# Patient Record
Sex: Female | Born: 1995 | Race: Black or African American | Hispanic: No | Marital: Single | Smoking: Never smoker
Health system: Southern US, Community
[De-identification: ages and names within clinical notes are randomized; demographics above are authoritative.]

## PROBLEM LIST (undated history)

## (undated) DIAGNOSIS — I10 Essential (primary) hypertension: Secondary | ICD-10-CM

## (undated) HISTORY — PX: TONSILLECTOMY: SUR1361

---

## 2018-03-15 ENCOUNTER — Emergency Department (HOSPITAL_BASED_OUTPATIENT_CLINIC_OR_DEPARTMENT_OTHER)
Admission: EM | Admit: 2018-03-15 | Discharge: 2018-03-15 | Disposition: A | Discharge status: Home / Self Care | Payer: Self-pay | Attending: Emergency Medicine | Admitting: Emergency Medicine

## 2018-03-15 ENCOUNTER — Encounter (HOSPITAL_BASED_OUTPATIENT_CLINIC_OR_DEPARTMENT_OTHER): Payer: Self-pay | Admitting: Emergency Medicine

## 2018-03-15 ENCOUNTER — Other Ambulatory Visit: Payer: Self-pay

## 2018-03-15 DIAGNOSIS — S61212A Laceration without foreign body of right middle finger without damage to nail, initial encounter: Secondary | ICD-10-CM | POA: Insufficient documentation

## 2018-03-15 DIAGNOSIS — Z23 Encounter for immunization: Secondary | ICD-10-CM | POA: Insufficient documentation

## 2018-03-15 DIAGNOSIS — S61210A Laceration without foreign body of right index finger without damage to nail, initial encounter: Secondary | ICD-10-CM | POA: Insufficient documentation

## 2018-03-15 DIAGNOSIS — W268XXA Contact with other sharp object(s), not elsewhere classified, initial encounter: Secondary | ICD-10-CM | POA: Insufficient documentation

## 2018-03-15 DIAGNOSIS — Y92810 Car as the place of occurrence of the external cause: Secondary | ICD-10-CM | POA: Insufficient documentation

## 2018-03-15 DIAGNOSIS — Y9389 Activity, other specified: Secondary | ICD-10-CM | POA: Insufficient documentation

## 2018-03-15 DIAGNOSIS — Y998 Other external cause status: Secondary | ICD-10-CM | POA: Insufficient documentation

## 2018-03-15 MED ORDER — TETANUS-DIPHTH-ACELL PERTUSSIS 5-2.5-18.5 LF-MCG/0.5 IM SUSP
INTRAMUSCULAR | Status: AC
  Filled 2018-03-15: qty 0.5

## 2018-03-15 MED ORDER — LIDOCAINE HCL (PF) 1 % IJ SOLN
30.0000 mL | Freq: Once | INTRAMUSCULAR | Status: AC
  Administered 2018-03-15: 30 mL

## 2018-03-15 MED ORDER — LIDOCAINE HCL 1 % IJ SOLN
INTRAMUSCULAR | Status: AC
  Filled 2018-03-15: qty 20

## 2018-03-15 MED ORDER — TETANUS-DIPHTH-ACELL PERTUSSIS 5-2.5-18.5 LF-MCG/0.5 IM SUSP
0.5000 mL | Freq: Once | INTRAMUSCULAR | Status: AC
  Administered 2018-03-15: 0.5 mL via INTRAMUSCULAR

## 2018-03-15 NOTE — ED Notes (Signed)
Dressing was already applied by RN per Pt

## 2018-03-15 NOTE — Discharge Instructions (Addendum)
Keep your wounds clean and dry. Wash with soap and water. Apply triple antibiotic ointment twice a day. Suture removal in 7-10 days. Follow up as needed.

## 2018-03-15 NOTE — ED Provider Notes (Signed)
[facility] EMERGENCY DEPARTMENT Provider Note   CSN: [id number] Arrival date & time: 03/15/18  1207     History   Chief Complaint Chief Complaint  Patient presents with  . Laceration    HPI Wanda Walsh is a 22 y.o. female.  HPI Wanda Walsh is a 22 y.o. female tensed emergency department complaining of laceration to the right second and third fingers.  Patient states that she reached behind a car seat to pick something off the floor and cut her fingertips on a razor blade that was they are.  Patient did not noted there is a really was there.  She sustained a laceration to the tips of the index and middle fingers.  Patient reports severe bleeding that was stopped with pressure.  She denies any numbness or tingling distally.  Denies any other injuries.  Tetanus is unknown.  History reviewed. No pertinent past medical history.  There are no active problems to display for this patient.   Past Surgical History:  Procedure Laterality Date  . TONSILLECTOMY       OB History   None      Home Medications    Prior to Admission medications   Not on File    Family History No family history on file.  Social History Social History   Tobacco Use  . Smoking status: Never Smoker  . Smokeless tobacco: Never Used  Substance Use Topics  . Alcohol use: Yes  . Drug use: Not on file     Allergies   Patient has no known allergies.   Review of Systems Review of Systems  Constitutional: Negative for chills and fever.  Musculoskeletal: Negative for arthralgias.  Skin: Positive for wound.  Neurological: Negative for weakness and numbness.  All other systems reviewed and are negative.    Physical Exam Updated Vital Signs BP (!) 161/112 (BP Location: Left Arm)   Pulse (!) 120   Temp 99.2 F (37.3 C) (Oral)   Resp 18   Ht 5\' 6"  (1.676 m)   Wt 108.9 kg (240 lb)   LMP 02/21/2018   SpO2 100%   BMI 38.74 kg/m   Physical Exam  Constitutional: She is  oriented to person, place, and time. She appears well-developed and well-nourished. No distress.  HENT:  Head: Normocephalic.  Eyes: Conjunctivae are normal.  Neck: Neck supple.  Cardiovascular: Normal rate, regular rhythm and normal heart sounds.  Pulmonary/Chest: Effort normal and breath sounds normal. No respiratory distress. She has no wheezes. She has no rales.  Musculoskeletal: She exhibits no edema.       Hands: 3cm laceratio to the fingertip of the index finger, gaping, no nail involvement. 0.5cm through and through the distal fignerpad of middle finger. Full rom of both fingers.   Neurological: She is alert and oriented to person, place, and time.  Skin: Skin is warm and dry.  Psychiatric: She has a normal mood and affect. Her behavior is normal.  Nursing note and vitals reviewed.    ED Treatments / Results  Labs (all labs ordered are listed, but only abnormal results are displayed) Labs Reviewed - No data to display  EKG None  Radiology No results found.  Procedures .Marland KitchenLaceration Repair Date/Time: 03/15/2018 12:58 PM Performed by: Jaynie Crumble, PA-C Authorized by: Jaynie Crumble, PA-C   Consent:    Consent obtained:  Verbal   Consent given by:  Patient   Risks discussed:  Infection, pain, need for additional repair and poor cosmetic result  Alternatives discussed:  No treatment and delayed treatment Anesthesia (see MAR for exact dosages):    Anesthesia method:  Local infiltration   Local anesthetic:  Lidocaine 2% w/o epi Laceration details:    Location:  Finger   Finger location:  R index finger   Length (cm):  3 Repair type:    Repair type:  Simple Pre-procedure details:    Preparation:  Patient was prepped and draped in usual sterile fashion Exploration:    Hemostasis achieved with:  Direct pressure   Wound extent: no nerve damage noted, no tendon damage noted and no underlying fracture noted   Treatment:    Area cleansed with:  Betadine  and saline   Amount of cleaning:  Standard   Irrigation solution:  Sterile saline   Irrigation method:  Syringe Skin repair:    Repair method:  Sutures   Suture size:  5-0   Suture material:  Nylon   Suture technique:  Simple interrupted   Number of sutures:  5 Approximation:    Approximation:  Close Post-procedure details:    Dressing:  Antibiotic ointment and non-adherent dressing   Patient tolerance of procedure:  Tolerated well, no immediate complications .Marland Kitchen.Laceration Repair Date/Time: 03/15/2018 12:59 PM Performed by: Jaynie CrumbleKirichenko, Annice Jolly, PA-C Authorized by: Jaynie CrumbleKirichenko, Mattelyn Imhoff, PA-C   Consent:    Consent obtained:  Verbal   Consent given by:  Patient   Risks discussed:  Infection, pain, need for additional repair and poor cosmetic result   Alternatives discussed:  No treatment and delayed treatment Anesthesia (see MAR for exact dosages):    Anesthesia method:  Local infiltration   Local anesthetic:  Lidocaine 2% w/o epi Laceration details:    Location:  Finger   Finger location:  R long finger   Length (cm):  1 Repair type:    Repair type:  Simple Pre-procedure details:    Preparation:  Patient was prepped and draped in usual sterile fashion Exploration:    Hemostasis achieved with:  Direct pressure   Wound exploration: wound explored through full range of motion   Treatment:    Area cleansed with:  Betadine and saline   Amount of cleaning:  Standard   Irrigation solution:  Sterile saline   Irrigation method:  Syringe Skin repair:    Repair method:  Sutures   Suture size:  5-0   Suture material:  Nylon   Suture technique:  Simple interrupted   Number of sutures:  4 Approximation:    Approximation:  Close Post-procedure details:    Dressing:  Antibiotic ointment and adhesive bandage   Patient tolerance of procedure:  Tolerated well, no immediate complications   (including critical care time)  Medications Ordered in ED Medications  lidocaine (XYLOCAINE)  1 % (with pres) injection (has no administration in time range)  Tdap (BOOSTRIX) 5-2.5-18.5 LF-MCG/0.5 injection (has no administration in time range)  lidocaine (PF) (XYLOCAINE) 1 % injection 30 mL (30 mLs Infiltration Given 03/15/18 1226)  Tdap (BOOSTRIX) injection 0.5 mL (0.5 mLs Intramuscular Given 03/15/18 1226)     Initial Impression / Assessment and Plan / ED Course  I have reviewed the triage vital signs and the nursing notes.  Pertinent labs & imaging results that were available during my care of the patient were reviewed by me and considered in my medical decision making (see chart for details).     Patient with lacerations to the distal fingertips of the index and middle fingers requiring repair with sutures.  Tetanus updated.  No  concern for bony injury.  Full range of motion of both fingers.  Home with antibiotic ointment, follow-up with primary care doctor as needed.  Suture removal in 7-10 days.  Vitals:   03/15/18 1210 03/15/18 1211  BP:  (!) 161/112  Pulse:  (!) 120  Resp:  18  Temp:  99.2 F (37.3 C)  TempSrc:  Oral  SpO2:  100%  Weight: 108.9 kg (240 lb)   Height: 5\' 6"  (1.676 m)     Final Clinical Impressions(s) / ED Diagnoses   Final diagnoses:  Laceration of right index finger without foreign body without damage to nail, initial encounter  Laceration of right middle finger without foreign body without damage to nail, initial encounter    ED Discharge Orders    None       Jaynie Crumble, PA-C 03/15/18 1536    Long, Arlyss Repress, MD 03/15/18 1944

## 2018-03-15 NOTE — ED Triage Notes (Signed)
Laceration to R index finger from a razor blade. Bleeding controlled.

## 2020-09-02 DEATH — deceased

## 2020-12-24 ENCOUNTER — Other Ambulatory Visit: Payer: Self-pay

## 2020-12-24 ENCOUNTER — Encounter (HOSPITAL_BASED_OUTPATIENT_CLINIC_OR_DEPARTMENT_OTHER): Payer: Self-pay | Admitting: Emergency Medicine

## 2020-12-24 ENCOUNTER — Emergency Department (HOSPITAL_BASED_OUTPATIENT_CLINIC_OR_DEPARTMENT_OTHER)
Admission: EM | Admit: 2020-12-24 | Discharge: 2020-12-24 | Disposition: A | Discharge status: Home / Self Care | Payer: Self-pay | Attending: Emergency Medicine | Admitting: Emergency Medicine

## 2020-12-24 DIAGNOSIS — R0789 Other chest pain: Secondary | ICD-10-CM | POA: Insufficient documentation

## 2020-12-24 DIAGNOSIS — A599 Trichomoniasis, unspecified: Secondary | ICD-10-CM | POA: Insufficient documentation

## 2020-12-24 DIAGNOSIS — R079 Chest pain, unspecified: Secondary | ICD-10-CM

## 2020-12-24 DIAGNOSIS — I10 Essential (primary) hypertension: Secondary | ICD-10-CM | POA: Insufficient documentation

## 2020-12-24 DIAGNOSIS — Z79899 Other long term (current) drug therapy: Secondary | ICD-10-CM | POA: Insufficient documentation

## 2020-12-24 DIAGNOSIS — Z20822 Contact with and (suspected) exposure to covid-19: Secondary | ICD-10-CM | POA: Insufficient documentation

## 2020-12-24 DIAGNOSIS — N390 Urinary tract infection, site not specified: Secondary | ICD-10-CM | POA: Insufficient documentation

## 2020-12-24 HISTORY — DX: Essential (primary) hypertension: I10

## 2020-12-24 LAB — COMPREHENSIVE METABOLIC PANEL
ALT: 10 U/L (ref 0–44)
AST: 12 U/L — ABNORMAL LOW (ref 15–41)
Albumin: 4.1 g/dL (ref 3.5–5.0)
Alkaline Phosphatase: 47 U/L (ref 38–126)
Anion gap: 9 (ref 5–15)
BUN: 6 mg/dL (ref 6–20)
CO2: 24 mmol/L (ref 22–32)
Calcium: 9.1 mg/dL (ref 8.9–10.3)
Chloride: 102 mmol/L (ref 98–111)
Creatinine, Ser: 0.74 mg/dL (ref 0.44–1.00)
GFR, Estimated: >60 mL/min (ref >60)
Glucose, Bld: 103 mg/dL — ABNORMAL HIGH (ref 70–99)
Potassium: 3.4 mmol/L — ABNORMAL LOW (ref 3.5–5.1)
Sodium: 135 mmol/L (ref 135–145)
Total Bilirubin: 0.7 mg/dL (ref 0.3–1.2)
Total Protein: 7.7 g/dL (ref 6.5–8.1)

## 2020-12-24 LAB — URINALYSIS, ROUTINE W REFLEX MICROSCOPIC
Bilirubin Urine: NEGATIVE
Glucose, UA: NEGATIVE mg/dL
Ketones, ur: >=80 mg/dL — AB
Nitrite: NEGATIVE
Protein, ur: NEGATIVE mg/dL
Specific Gravity, Urine: 1.025 (ref 1.005–1.030)
pH: 6 (ref 5.0–8.0)

## 2020-12-24 LAB — CBC WITH DIFFERENTIAL/PLATELET
Abs Immature Granulocytes: 0.01 10*3/uL (ref 0.00–0.07)
Basophils Absolute: 0 10*3/uL (ref 0.0–0.1)
Basophils Relative: 1 %
Eosinophils Absolute: 0 10*3/uL (ref 0.0–0.5)
Eosinophils Relative: 1 %
HCT: 39.1 % (ref 36.0–46.0)
Hemoglobin: 12.3 g/dL (ref 12.0–15.0)
Immature Granulocytes: 0 %
Lymphocytes Relative: 28 %
Lymphs Abs: 1.8 10*3/uL (ref 0.7–4.0)
MCH: 24.5 pg — ABNORMAL LOW (ref 26.0–34.0)
MCHC: 31.5 g/dL (ref 30.0–36.0)
MCV: 77.9 fL — ABNORMAL LOW (ref 80.0–100.0)
Monocytes Absolute: 0.5 10*3/uL (ref 0.1–1.0)
Monocytes Relative: 7 %
Neutro Abs: 4.1 10*3/uL (ref 1.7–7.7)
Neutrophils Relative %: 63 %
Platelets: 324 10*3/uL (ref 150–400)
RBC: 5.02 MIL/uL (ref 3.87–5.11)
RDW: 15.9 % — ABNORMAL HIGH (ref 11.5–15.5)
Smear Review: NORMAL
WBC Morphology: ABNORMAL
WBC: 6.4 10*3/uL (ref 4.0–10.5)
nRBC: 0 % (ref 0.0–0.2)

## 2020-12-24 LAB — D-DIMER, QUANTITATIVE: D-Dimer, Quant: 0.29 ug/mL-FEU (ref 0.00–0.50)

## 2020-12-24 LAB — PREGNANCY, URINE: Preg Test, Ur: NEGATIVE

## 2020-12-24 LAB — LIPASE, BLOOD: Lipase: 25 U/L (ref 11–51)

## 2020-12-24 LAB — TROPONIN I (HIGH SENSITIVITY): Troponin I (High Sensitivity): <2 ng/L (ref <18)

## 2020-12-24 LAB — URINALYSIS, MICROSCOPIC (REFLEX): WBC, UA: >50 WBC/hpf (ref 0–5)

## 2020-12-24 MED ORDER — METRONIDAZOLE 500 MG PO TABS: 500.0000 mg | ORAL_TABLET | Freq: Two times a day (BID) | ORAL | 0 refills | Status: AC

## 2020-12-24 MED ORDER — CEFTRIAXONE SODIUM 500 MG IJ SOLR
INTRAMUSCULAR | Status: AC
  Filled 2020-12-24: qty 500

## 2020-12-24 MED ORDER — LIDOCAINE HCL (PF) 1 % IJ SOLN
INTRAMUSCULAR | Status: AC
  Administered 2020-12-24: 1.2 mL
  Filled 2020-12-24: qty 5

## 2020-12-24 MED ORDER — CEFTRIAXONE SODIUM 500 MG IJ SOLR
250.0000 mg | Freq: Once | INTRAMUSCULAR | Status: AC
  Administered 2020-12-24: 250 mg via INTRAMUSCULAR

## 2020-12-24 MED ORDER — AMLODIPINE BESYLATE 2.5 MG PO TABS: 5.0000 mg | ORAL_TABLET | Freq: Every day | ORAL | 0 refills | Status: DC

## 2020-12-24 MED ORDER — CEPHALEXIN 500 MG PO CAPS: 500.0000 mg | ORAL_CAPSULE | Freq: Three times a day (TID) | ORAL | 0 refills | Status: AC

## 2020-12-24 MED ORDER — DOXYCYCLINE HYCLATE 100 MG PO CAPS: 100.0000 mg | ORAL_CAPSULE | Freq: Two times a day (BID) | ORAL | 0 refills | Status: DC

## 2020-12-24 NOTE — ED Triage Notes (Signed)
Pt arrives pov with driver, with c/o concern for hypertension. Pt reports just being started on amlodipine. Pt endorses left side CP, with mild HA. Pt ambulatory.

## 2020-12-24 NOTE — Discharge Instructions (Signed)
We are sending a swab for gonorrhea and chlamydia, if these return positive, begin/fill the doxycycline prescription given to you. If they are negative you can continue to take the flagyl for trichomonas and keflex for UTI without taking the doxycycline.

## 2020-12-24 NOTE — ED Notes (Signed)
I attempt IV access/lab draw. My colleague will attempt u/s-guided IV shortly.

## 2020-12-24 NOTE — ED Notes (Signed)
As I write this, Dr. Dalene Seltzer is examining her.

## 2020-12-24 NOTE — ED Provider Notes (Signed)
[facility] EMERGENCY DEPARTMENT Provider Note   CSN: [id number] Arrival date & time: 12/24/20  1043     History Chief Complaint  Patient presents with  . Hypertension    Wanda Walsh is a 25 y.o. female.  HPI     Chest tightness getting worse today and sharp pain with deep breaths, took amlodpine before leaving the house. Now CP nonexistent but was 5/10 before.   Low appetite for the last week, nausea, not eating or drinking much Tightness left side of chest and pressure in back for 2 days  Sleep helps but wakes up and there, can lay down and it comes, not specifically worse with exertion Shortness of breath with it, a few episodes, thought was panic attack both times, heart was racing.  1/15 had CT head, CBC, BMP Yesterday took amlodipine and BP was 173 and then 169, mom gave her clonidine because left hospital 1/16, had lab work, checked TSH which was normal, troponin negative for MI, low dose 2.5mg  amlodipine  Hx of htn in family, mom had it at 54   Past Medical History:  Diagnosis Date  . Hypertension     There are no problems to display for this patient.   Past Surgical History:  Procedure Laterality Date  . TONSILLECTOMY       OB History   No obstetric history on file.     History reviewed. No pertinent family history.  Social History   Tobacco Use  . Smoking status: Never Smoker  . Smokeless tobacco: Never Used  Substance Use Topics  . Alcohol use: Not Currently  . Drug use: Never    Home Medications Prior to Admission medications   Medication Sig Start Date End Date Taking? Authorizing Provider  cephALEXin (KEFLEX) 500 MG capsule Take 1 capsule (500 mg total) by mouth 3 (three) times daily for 7 days. 12/24/20 12/31/20 Yes Alvira Monday, MD  doxycycline (VIBRAMYCIN) 100 MG capsule Take 1 capsule (100 mg total) by mouth 2 (two) times daily. 12/24/20  Yes Alvira Monday, MD  metroNIDAZOLE (FLAGYL) 500 MG tablet Take 1 tablet  (500 mg total) by mouth 2 (two) times daily for 10 days. 12/24/20 01/03/21 Yes Alvira Monday, MD  amLODipine (NORVASC) 2.5 MG tablet Take 2 tablets (5 mg total) by mouth daily. 12/24/20 01/23/21  Alvira Monday, MD    Allergies    Patient has no known allergies.  Review of Systems   Review of Systems  Constitutional: Positive for activity change, appetite change, chills and fatigue. Negative for fever.  HENT: Negative for congestion, rhinorrhea and sore throat.   Eyes: Negative for visual disturbance.  Respiratory: Positive for shortness of breath. Negative for cough.   Cardiovascular: Positive for chest pain and palpitations.  Gastrointestinal: Positive for abdominal pain (thinks from not eating), diarrhea and nausea. Negative for vomiting.  Genitourinary: Negative for dysuria.  Musculoskeletal: Negative for myalgias.  Skin: Negative for rash.  Neurological: Positive for headaches (now improved).    Physical Exam Updated Vital Signs BP (!) 145/83   Pulse 90   Temp 99.2 F (37.3 C) (Oral)   Resp (!) 23   Ht 5\' 6"  (1.676 m)   Wt 117.9 kg   LMP 12/10/2020   SpO2 100%   BMI 41.97 kg/m   Physical Exam Vitals and nursing note reviewed.  Constitutional:      General: She is not in acute distress.    Appearance: She is well-developed and well-nourished. She is not diaphoretic.  HENT:  Head: Normocephalic and atraumatic.  Eyes:     Extraocular Movements: EOM normal.     Conjunctiva/sclera: Conjunctivae normal.  Cardiovascular:     Rate and Rhythm: Normal rate and regular rhythm.     Pulses: Intact distal pulses.     Heart sounds: Normal heart sounds. No murmur heard. No friction rub. No gallop.   Pulmonary:     Effort: Pulmonary effort is normal. No respiratory distress.     Breath sounds: Normal breath sounds. No wheezing or rales.  Abdominal:     General: There is no distension.     Palpations: Abdomen is soft.     Tenderness: There is no abdominal tenderness.  There is no guarding.  Musculoskeletal:        General: No tenderness or edema.     Cervical back: Normal range of motion.  Skin:    General: Skin is warm and dry.     Findings: No erythema or rash.  Neurological:     Mental Status: She is alert and oriented to person, place, and time.     ED Results / Procedures / Treatments   Labs (all labs ordered are listed, but only abnormal results are displayed) Labs Reviewed  CBC WITH DIFFERENTIAL/PLATELET - Abnormal; Notable for the following components:      Result Value   MCV 77.9 (*)    MCH 24.5 (*)    RDW 15.9 (*)    All other components within normal limits  COMPREHENSIVE METABOLIC PANEL - Abnormal; Notable for the following components:   Potassium 3.4 (*)    Glucose, Bld 103 (*)    AST 12 (*)    All other components within normal limits  URINALYSIS, ROUTINE W REFLEX MICROSCOPIC - Abnormal; Notable for the following components:   APPearance CLOUDY (*)    Hgb urine dipstick SMALL (*)    Ketones, ur >=80 (*)    Leukocytes,Ua MODERATE (*)    All other components within normal limits  URINALYSIS, MICROSCOPIC (REFLEX) - Abnormal; Notable for the following components:   Bacteria, UA MANY (*)    Trichomonas, UA PRESENT (*)    All other components within normal limits  SARS CORONAVIRUS 2 (TAT 6-24 HRS)  D-DIMER, QUANTITATIVE (NOT AT [facility])  LIPASE, BLOOD  PREGNANCY, URINE  GC/CHLAMYDIA PROBE AMP ([place]) NOT AT [facility]  GC/CHLAMYDIA PROBE AMP ([place]) NOT AT [facility]  TROPONIN I (HIGH SENSITIVITY)    EKG EKG Interpretation  Date/Time:  Saturday December 24 2020 11:09:05 EST Ventricular Rate:  88 PR Interval:    QRS Duration: 99 QT Interval:  368 QTC Calculation: 446 R Axis:   40 Text Interpretation: Sinus rhythm Borderline T abnormalities, anterior leads No previous ECGs available Confirmed by Alvira Monday ([id number]) on 12/24/2020 11:36:57 AM   Radiology No results found.  Procedures Procedures (including critical  care time)  Medications Ordered in ED Medications  cefTRIAXone (ROCEPHIN) 500 MG injection (  Not Given 12/24/20 1528)  cefTRIAXone (ROCEPHIN) injection 250 mg (250 mg Intramuscular Given 12/24/20 1527)  lidocaine (PF) (XYLOCAINE) 1 % injection (1.2 mLs  Given 12/24/20 1527)    ED Course  I have reviewed the triage vital signs and the nursing notes.  Pertinent labs & imaging results that were available during my care of the patient were reviewed by me and considered in my medical decision making (see chart for details).    MDM Rules/Calculators/A&P  25 year old female with recent diagnosis of hypertension, with recent evaluation in the emergency department at [facility] on January 15 and January 16 with work-up including normal CT head, CBC, BMP, TSH, troponin, initiated on 2.5 mg of amlodipine, presents with concern for worsening chest tightness, and also notes symptoms such as nausea, fatigue, diarrhea.   EKG was completed and evaluate me and showed no acute ST elevations or signs of pericarditis.  No previous EKGs in our system to compare to.  Troponin negative, and given duration of symptoms have low suspicion for ACS.  She is low risk [place] with a negative D-dimer and have low suspicion for pulmonary embolus.  Her blood pressures are mildly elevated in the emergency department which may be secondary to stress, but do feel it is reasonable for her to increase her dosing of amlodipine from 2.5 mg to 5.  Abdominal exam is benign, have low suspicion for appendicitis, pancreatitis, cholecystitis, tubo-ovarian abscess, PID, torsion by history and exam.  Urinalysis does show findings concerning for urinary tract infection and was also positive for trichomonas.  Given trichomonas, will send GC chlamydia self swab, and given Rocephin empirically.  Given prescription for Flagyl, Keflex for trichomonas and urinary tract infection, and prescription for doxycycline if  her gonorrhea and committee a test come back positive.  Sent COVID-19 testing given constellation of symptoms.  Recommend amlodipine 5 mg, treatment of UTI/trichomonas, PCP follow up. Patient discharged in stable condition with understanding of reasons to return.   Final Clinical Impression(s) / ED Diagnoses Final diagnoses:  Urinary tract infection without hematuria, site unspecified  Trichomonas infection  Chest pain, unspecified type  COVID-19 virus test result unknown    Rx / DC Orders ED Discharge Orders         Ordered    cephALEXin (KEFLEX) 500 MG capsule  3 times daily        12/24/20 1520    metroNIDAZOLE (FLAGYL) 500 MG tablet  2 times daily        12/24/20 1520    doxycycline (VIBRAMYCIN) 100 MG capsule  2 times daily        12/24/20 1520    amLODipine (NORVASC) 2.5 MG tablet  Daily        12/24/20 1701           Alvira Monday, MD 12/24/20 1706

## 2020-12-25 LAB — SARS CORONAVIRUS 2 (TAT 6-24 HRS): SARS Coronavirus 2: NEGATIVE

## 2020-12-26 LAB — GC/CHLAMYDIA PROBE AMP (~~LOC~~) NOT AT ARMC
Chlamydia: NEGATIVE
Comment: NEGATIVE
Comment: NORMAL
Neisseria Gonorrhea: NEGATIVE

## 2021-05-26 ENCOUNTER — Other Ambulatory Visit: Payer: Self-pay

## 2021-05-26 ENCOUNTER — Emergency Department (HOSPITAL_BASED_OUTPATIENT_CLINIC_OR_DEPARTMENT_OTHER)
Admission: EM | Admit: 2021-05-26 | Discharge: 2021-05-26 | Disposition: A | Discharge status: Home / Self Care | Payer: Self-pay | Attending: Emergency Medicine | Admitting: Emergency Medicine

## 2021-05-26 ENCOUNTER — Encounter (HOSPITAL_BASED_OUTPATIENT_CLINIC_OR_DEPARTMENT_OTHER): Payer: Self-pay | Admitting: *Deleted

## 2021-05-26 DIAGNOSIS — Z20822 Contact with and (suspected) exposure to covid-19: Secondary | ICD-10-CM

## 2021-05-26 DIAGNOSIS — U071 COVID-19: Secondary | ICD-10-CM | POA: Insufficient documentation

## 2021-05-26 DIAGNOSIS — I10 Essential (primary) hypertension: Secondary | ICD-10-CM

## 2021-05-26 DIAGNOSIS — Z79899 Other long term (current) drug therapy: Secondary | ICD-10-CM | POA: Insufficient documentation

## 2021-05-26 DIAGNOSIS — R059 Cough, unspecified: Secondary | ICD-10-CM

## 2021-05-26 DIAGNOSIS — R0981 Nasal congestion: Secondary | ICD-10-CM

## 2021-05-26 LAB — RESP PANEL BY RT-PCR (FLU A&B, COVID) ARPGX2
Influenza A by PCR: NEGATIVE
Influenza B by PCR: NEGATIVE
SARS Coronavirus 2 by RT PCR: POSITIVE — AB

## 2021-05-26 NOTE — ED Triage Notes (Signed)
Covid exposure. She has a cough and sneezing x 3 days. Her home test was negative.

## 2021-05-26 NOTE — ED Provider Notes (Signed)
[facility] EMERGENCY DEPARTMENT Provider Note   CSN: [id number] Arrival date & time: 05/26/21  1159     History Chief Complaint  Patient presents with   Nasal Congestion    Wanda Walsh is a 25 y.o. female.  HPI Patient has had some cough, sneezing, congestion for the past 3 days.  Negative antigen test at home.  For COVID.  She is vaccinated x2 and boosted x1.  She denies any nausea vomiting diarrhea.  No fevers or chills.  She states that her cough is relatively dry.  She states that she is primarily having trouble with her congestion.  Denies any shortness of breath or chest pain.  No other significant associated symptoms.     Past Medical History:  Diagnosis Date   Hypertension     There are no problems to display for this patient.   Past Surgical History:  Procedure Laterality Date   TONSILLECTOMY       OB History   No obstetric history on file.     No family history on file.  Social History   Tobacco Use   Smoking status: Never   Smokeless tobacco: Never  Substance Use Topics   Alcohol use: Not Currently   Drug use: Never    Home Medications Prior to Admission medications   Medication Sig Start Date End Date Taking? Authorizing Provider  lisinopril (ZESTRIL) 10 MG tablet Take 1 tablet by mouth daily. 03/30/21  Yes [provider]  amLODipine (NORVASC) 2.5 MG tablet Take 2 tablets (5 mg total) by mouth daily. 12/24/20 01/23/21  Alvira Monday, MD  doxycycline (VIBRAMYCIN) 100 MG capsule Take 1 capsule (100 mg total) by mouth 2 (two) times daily. 12/24/20   Alvira Monday, MD    Allergies    Patient has no known allergies.  Review of Systems   Review of Systems  Constitutional:  Positive for fatigue. Negative for chills and fever.  HENT:  Positive for congestion.   Eyes:  Negative for pain.  Respiratory:  Positive for cough. Negative for shortness of breath.   Cardiovascular:  Negative for chest pain and leg swelling.   Gastrointestinal:  Negative for abdominal pain and vomiting.  Genitourinary:  Negative for dysuria.  Musculoskeletal:  Negative for myalgias.  Skin:  Negative for rash.  Neurological:  Negative for dizziness and headaches.   Physical Exam Updated Vital Signs BP (!) 166/106 (BP Location: Right Arm)   Pulse 94   Temp 99 F (37.2 C) (Oral)   Resp 18   Ht 5\' 6"  (1.676 m)   Wt 121.4 kg   LMP 05/05/2021   SpO2 99%   BMI 43.19 kg/m   Physical Exam Vitals and nursing note reviewed.  Constitutional:      General: She is not in acute distress.    Comments: Pleasant well-appearing 25 year old.  In no acute distress.  Sitting comfortably in bed.  Able answer questions appropriately follow commands. No increased work of breathing. Speaking in full sentences.   HENT:     Head: Normocephalic and atraumatic.     Nose: Nose normal.  Eyes:     General: No scleral icterus. Cardiovascular:     Rate and Rhythm: Normal rate and regular rhythm.     Pulses: Normal pulses.     Heart sounds: Normal heart sounds.  Pulmonary:     Effort: Pulmonary effort is normal. No respiratory distress.     Breath sounds: Normal breath sounds. No wheezing.  Abdominal:  Palpations: Abdomen is soft.     Tenderness: There is no abdominal tenderness.  Musculoskeletal:     Cervical back: Normal range of motion.     Right lower leg: No edema.     Left lower leg: No edema.  Skin:    General: Skin is warm and dry.     Capillary Refill: Capillary refill takes less than 2 seconds.  Neurological:     Mental Status: She is alert. Mental status is at baseline.  Psychiatric:        Mood and Affect: Mood normal.        Behavior: Behavior normal.    ED Results / Procedures / Treatments   Labs (all labs ordered are listed, but only abnormal results are displayed) Labs Reviewed  RESP PANEL BY RT-PCR (FLU A&B, COVID) [id number]    EKG None  Radiology No results found.  Procedures Procedures   Medications  Ordered in ED Medications - No data to display  ED Course  I have reviewed the triage vital signs and the nursing notes.  Pertinent labs & imaging results that were available during my care of the patient were reviewed by me and considered in my medical decision making (see chart for details).    MDM Rules/Calculators/A&P                          Patient is a 25 year old female presented today with some cough congestion sinus pressure.  On physical exam patient is well-appearing heart rate is 90 on my exam.  She does have elevated blood pressure.  She states that she takes medication for this and that her PCP is working with her on getting the right blood pressure medication.  She denies any other associate symptoms besides the ones listed above.  No chest pain or headache.  No shortness of breath.  Suspect COVID or other viral illness.  Lungs are clear to auscultation.  Will discharge home with return precautions and follow-up with PCP.  COVID test pending at this time.  Patient understands conservative therapy.  Wanda Walsh was evaluated in Emergency Department on 05/26/2021 for the symptoms described in the history of present illness. She was evaluated in the context of the global COVID-19 pandemic, which necessitated consideration that the patient might be at risk for infection with the SARS-CoV-2 virus that causes COVID-19. Institutional protocols and algorithms that pertain to the evaluation of patients at risk for COVID-19 are in a state of rapid change based on information released by regulatory bodies including the CDC and federal and state organizations. These policies and algorithms were followed during the patient's care in the ED.  Final Clinical Impression(s) / ED Diagnoses Final diagnoses:  Cough  Hypertension, unspecified type  Nasal congestion  Suspected COVID-19 virus infection    Rx / DC Orders ED Discharge Orders     None        Gailen Shelter, [place] 05/26/21  1329    Melene Plan, DO 05/26/21 1424

## 2021-05-26 NOTE — Discharge Instructions (Signed)
Your COVID test is pending the results will be available on MyChart app in 2 to 4 hours.  Drink plenty of water take Tylenol and ibuprofen as discussed below.  Also recommend over-the-counter antihistamine such as Benadryl which is drowsy medication and Zyrtec which is nondrowsy.  Please follow-up with your primary care provider.  Use Flonase 2 sprays in each nostril twice daily

## 2021-08-09 ENCOUNTER — Encounter (HOSPITAL_BASED_OUTPATIENT_CLINIC_OR_DEPARTMENT_OTHER): Payer: Self-pay | Admitting: Urology

## 2021-08-09 ENCOUNTER — Other Ambulatory Visit: Payer: Self-pay

## 2021-08-09 ENCOUNTER — Emergency Department (HOSPITAL_BASED_OUTPATIENT_CLINIC_OR_DEPARTMENT_OTHER)
Admission: EM | Admit: 2021-08-09 | Discharge: 2021-08-09 | Disposition: A | Discharge status: Home / Self Care | Payer: Self-pay | Attending: Emergency Medicine | Admitting: Emergency Medicine

## 2021-08-09 DIAGNOSIS — L0291 Cutaneous abscess, unspecified: Secondary | ICD-10-CM

## 2021-08-09 DIAGNOSIS — I10 Essential (primary) hypertension: Secondary | ICD-10-CM | POA: Insufficient documentation

## 2021-08-09 DIAGNOSIS — L02413 Cutaneous abscess of right upper limb: Secondary | ICD-10-CM | POA: Insufficient documentation

## 2021-08-09 DIAGNOSIS — Z79899 Other long term (current) drug therapy: Secondary | ICD-10-CM | POA: Insufficient documentation

## 2021-08-09 MED ORDER — SULFAMETHOXAZOLE-TRIMETHOPRIM 800-160 MG PO TABS: 1.0000 | ORAL_TABLET | Freq: Two times a day (BID) | ORAL | 0 refills | Status: AC

## 2021-08-09 MED ORDER — SULFAMETHOXAZOLE-TRIMETHOPRIM 800-160 MG PO TABS
1.0000 | ORAL_TABLET | Freq: Once | ORAL | Status: AC
  Administered 2021-08-09: 1 via ORAL
  Filled 2021-08-09: qty 1

## 2021-08-09 MED ORDER — LIDOCAINE-EPINEPHRINE 2 %-1:100000 IJ SOLN: 20.0000 mL | Freq: Once | INTRAMUSCULAR | Status: DC

## 2021-08-09 MED ORDER — LIDOCAINE-EPINEPHRINE (PF) 2 %-1:200000 IJ SOLN
INTRAMUSCULAR | Status: AC
  Administered 2021-08-09: 20 mL via INTRADERMAL
  Filled 2021-08-09: qty 20

## 2021-08-09 MED ORDER — OXYCODONE-ACETAMINOPHEN 5-325 MG PO TABS
2.0000 | ORAL_TABLET | Freq: Once | ORAL | Status: AC
Start: 2021-08-09 — End: 2021-08-09
  Administered 2021-08-09: 2 via ORAL
  Filled 2021-08-09: qty 2

## 2021-08-09 MED ORDER — LIDOCAINE-EPINEPHRINE (PF) 2 %-1:200000 IJ SOLN: 20.0000 mL | Freq: Once | INTRAMUSCULAR | Status: AC

## 2021-08-09 NOTE — ED Triage Notes (Signed)
Abscess under right axilla, no drainage noted.  H/o of same.

## 2021-08-09 NOTE — ED Notes (Signed)
ED Provider at bedside. 

## 2021-08-09 NOTE — ED Provider Notes (Signed)
[facility] EMERGENCY DEPARTMENT Provider Note   CSN: [id number] Arrival date & time: 08/09/21  0042     History Chief Complaint  Patient presents with   Abscess    Wanda Walsh is a 25 y.o. female.   Abscess Location:  Shoulder/arm Shoulder/arm abscess location:  R axilla Size:  2x3 Abscess quality: fluctuance, induration, painful and redness   Red streaking: no   Duration:  5 days Progression:  Worsening Pain details:    Severity:  Mild   Timing:  Constant   Progression:  Worsening Chronicity:  New Context: not diabetes, not immunosuppression and not injected drug use   Relieved by:  None tried Worsened by:  Nothing Ineffective treatments:  None tried     Past Medical History:  Diagnosis Date   Hypertension     There are no problems to display for this patient.   Past Surgical History:  Procedure Laterality Date   TONSILLECTOMY       OB History   No obstetric history on file.     History reviewed. No pertinent family history.  Social History   Tobacco Use   Smoking status: Never   Smokeless tobacco: Never  Substance Use Topics   Alcohol use: Not Currently   Drug use: Never    Home Medications Prior to Admission medications   Medication Sig Start Date End Date Taking? Authorizing Provider  sulfamethoxazole-trimethoprim (BACTRIM DS) 800-160 MG tablet Take 1 tablet by mouth 2 (two) times daily for 7 days. 08/09/21 08/16/21 Yes Rivers Gassmann, Barbara Cower, MD  amLODipine (NORVASC) 2.5 MG tablet Take 2 tablets (5 mg total) by mouth daily. 12/24/20 01/23/21  Alvira Monday, MD  doxycycline (VIBRAMYCIN) 100 MG capsule Take 1 capsule (100 mg total) by mouth 2 (two) times daily. 12/24/20   Alvira Monday, MD  lisinopril (ZESTRIL) 10 MG tablet Take 1 tablet by mouth daily. 03/30/21   [provider]    Allergies    Patient has no known allergies.  Review of Systems   Review of Systems  All other systems reviewed and are negative.  Physical  Exam Updated Vital Signs BP (!) 156/118 (BP Location: Left Arm)   Pulse 87   Temp 98.7 F (37.1 C) (Oral)   Resp 14   Ht 5\' 6"  (1.676 m)   Wt 117.9 kg   SpO2 100%   BMI 41.97 kg/m   Physical Exam Vitals and nursing note reviewed.  Constitutional:      Appearance: She is well-developed.  HENT:     Head: Normocephalic and atraumatic.     Mouth/Throat:     Mouth: Mucous membranes are moist.     Pharynx: Oropharynx is clear.  Eyes:     Pupils: Pupils are equal, round, and reactive to light.  Cardiovascular:     Rate and Rhythm: Normal rate and regular rhythm.  Pulmonary:     Effort: No respiratory distress.     Breath sounds: No stridor.  Abdominal:     General: Abdomen is flat. There is no distension.  Musculoskeletal:     Cervical back: Normal range of motion.  Skin:    General: Skin is warm and dry.     Comments: Multiple indurated areas in right axilla, some fluctuant, some not. Does have a 2x3 cm area of fluctuance and ttp in R axilla, arm side. BUS shows fluid collection.  Neurological:     General: No focal deficit present.     Mental Status: She is alert.  ED Results / Procedures / Treatments   Labs (all labs ordered are listed, but only abnormal results are displayed) Labs Reviewed - No data to display  EKG None  Radiology No results found.  Procedures .Marland KitchenIncision and Drainage  Date/Time: 08/09/2021 3:55 AM Performed by: Marily Memos, MD Authorized by: Marily Memos, MD   Consent:    Consent obtained:  Verbal   Consent given by:  Patient   Risks, benefits, and alternatives were discussed: yes     Risks discussed:  Bleeding, incomplete drainage, infection, damage to other organs and pain   Alternatives discussed:  No treatment, delayed treatment, alternative treatment, observation and referral Universal protocol:    Procedure explained and questions answered to patient or proxy's satisfaction: yes     Relevant documents present and verified: yes      Imaging studies available: no   Location:    Type:  Abscess   Size:  2x3   Location:  Upper extremity   Upper extremity location:  Arm   Arm location:  R upper arm Pre-procedure details:    Skin preparation:  Chlorhexidine with alcohol Sedation:    Sedation type:  None Anesthesia:    Anesthesia method:  Local infiltration   Local anesthetic:  Lidocaine 2% WITH epi Procedure type:    Complexity:  Simple Procedure details:    Ultrasound guidance: yes     Needle aspiration: no     Incision types:  Single straight   Wound management:  Probed and deloculated and irrigated with saline   Drainage:  Bloody and purulent   Drainage amount:  Moderate   Wound treatment:  Wound left open   Packing materials:  None Post-procedure details:    Procedure completion:  Tolerated   Medications Ordered in ED Medications  oxyCODONE-acetaminophen (PERCOCET/ROXICET) 5-325 MG per tablet 2 tablet (2 tablets Oral Given 08/09/21 0301)  sulfamethoxazole-trimethoprim (BACTRIM DS) 800-160 MG per tablet 1 tablet (1 tablet Oral Given 08/09/21 0301)  lidocaine-EPINEPHrine (XYLOCAINE W/EPI) 2 %-1:200000 (PF) injection 20 mL (20 mLs Intradermal Given 08/09/21 0301)    ED Course  I have reviewed the triage vital signs and the nursing notes.  Pertinent labs & imaging results that were available during my care of the patient were reviewed by me and considered in my medical decision making (see chart for details).    MDM Rules/Calculators/A&P                         Abscess drained. Antibiotics initiated. Will fu w/ pcp for management.   Final Clinical Impression(s) / ED Diagnoses Final diagnoses:  Abscess  Hypertension, unspecified type    Rx / DC Orders ED Discharge Orders          Ordered    sulfamethoxazole-trimethoprim (BACTRIM DS) 800-160 MG tablet  2 times daily        08/09/21 0339             Josanne Boerema, Barbara Cower, MD 08/09/21 [phone]

## 2021-09-06 ENCOUNTER — Encounter (HOSPITAL_BASED_OUTPATIENT_CLINIC_OR_DEPARTMENT_OTHER): Payer: Self-pay | Admitting: *Deleted

## 2021-09-06 ENCOUNTER — Emergency Department (HOSPITAL_BASED_OUTPATIENT_CLINIC_OR_DEPARTMENT_OTHER): Payer: Self-pay

## 2021-09-06 ENCOUNTER — Other Ambulatory Visit: Payer: Self-pay

## 2021-09-06 ENCOUNTER — Emergency Department (HOSPITAL_BASED_OUTPATIENT_CLINIC_OR_DEPARTMENT_OTHER)
Admission: EM | Admit: 2021-09-06 | Discharge: 2021-09-06 | Disposition: A | Discharge status: Home / Self Care | Payer: Self-pay | Attending: Emergency Medicine | Admitting: Emergency Medicine

## 2021-09-06 DIAGNOSIS — R072 Precordial pain: Secondary | ICD-10-CM | POA: Insufficient documentation

## 2021-09-06 DIAGNOSIS — R202 Paresthesia of skin: Secondary | ICD-10-CM | POA: Insufficient documentation

## 2021-09-06 DIAGNOSIS — Z79899 Other long term (current) drug therapy: Secondary | ICD-10-CM | POA: Insufficient documentation

## 2021-09-06 DIAGNOSIS — R519 Headache, unspecified: Secondary | ICD-10-CM | POA: Insufficient documentation

## 2021-09-06 DIAGNOSIS — R300 Dysuria: Secondary | ICD-10-CM | POA: Insufficient documentation

## 2021-09-06 DIAGNOSIS — I10 Essential (primary) hypertension: Secondary | ICD-10-CM | POA: Insufficient documentation

## 2021-09-06 LAB — BASIC METABOLIC PANEL
Anion gap: 8 (ref 5–15)
BUN: 7 mg/dL (ref 6–20)
CO2: 26 mmol/L (ref 22–32)
Calcium: 9.7 mg/dL (ref 8.9–10.3)
Chloride: 100 mmol/L (ref 98–111)
Creatinine, Ser: 0.86 mg/dL (ref 0.44–1.00)
GFR, Estimated: >60 mL/min (ref >60)
Glucose, Bld: 106 mg/dL — ABNORMAL HIGH (ref 70–99)
Potassium: 3.3 mmol/L — ABNORMAL LOW (ref 3.5–5.1)
Sodium: 134 mmol/L — ABNORMAL LOW (ref 135–145)

## 2021-09-06 LAB — URINALYSIS, ROUTINE W REFLEX MICROSCOPIC
Bilirubin Urine: NEGATIVE
Glucose, UA: NEGATIVE mg/dL
Ketones, ur: NEGATIVE mg/dL
Leukocytes,Ua: NEGATIVE
Nitrite: NEGATIVE
Protein, ur: NEGATIVE mg/dL
Specific Gravity, Urine: 1.02 (ref 1.005–1.030)
pH: 7 (ref 5.0–8.0)

## 2021-09-06 LAB — CBC WITH DIFFERENTIAL/PLATELET
Abs Immature Granulocytes: 0.01 10*3/uL (ref 0.00–0.07)
Basophils Absolute: 0 10*3/uL (ref 0.0–0.1)
Basophils Relative: 1 %
Eosinophils Absolute: 0.1 10*3/uL (ref 0.0–0.5)
Eosinophils Relative: 1 %
HCT: 40.4 % (ref 36.0–46.0)
Hemoglobin: 12.4 g/dL (ref 12.0–15.0)
Immature Granulocytes: 0 %
Lymphocytes Relative: 38 %
Lymphs Abs: 2.6 10*3/uL (ref 0.7–4.0)
MCH: 23.6 pg — ABNORMAL LOW (ref 26.0–34.0)
MCHC: 30.7 g/dL (ref 30.0–36.0)
MCV: 77 fL — ABNORMAL LOW (ref 80.0–100.0)
Monocytes Absolute: 0.5 10*3/uL (ref 0.1–1.0)
Monocytes Relative: 7 %
Neutro Abs: 3.6 10*3/uL (ref 1.7–7.7)
Neutrophils Relative %: 53 %
Platelets: 363 10*3/uL (ref 150–400)
RBC: 5.25 MIL/uL — ABNORMAL HIGH (ref 3.87–5.11)
RDW: 16.8 % — ABNORMAL HIGH (ref 11.5–15.5)
WBC: 6.7 10*3/uL (ref 4.0–10.5)
nRBC: 0 % (ref 0.0–0.2)

## 2021-09-06 LAB — TROPONIN I (HIGH SENSITIVITY)
Troponin I (High Sensitivity): <2 ng/L (ref <18)
Troponin I (High Sensitivity): <2 ng/L (ref <18)

## 2021-09-06 LAB — URINALYSIS, MICROSCOPIC (REFLEX)

## 2021-09-06 LAB — PREGNANCY, URINE: Preg Test, Ur: NEGATIVE

## 2021-09-06 MED ORDER — LISINOPRIL 20 MG PO TABS: 20.0000 mg | ORAL_TABLET | Freq: Every day | ORAL | 0 refills | Status: AC

## 2021-09-06 MED ORDER — LISINOPRIL 10 MG PO TABS
10.0000 mg | ORAL_TABLET | Freq: Once | ORAL | Status: AC
  Administered 2021-09-06: 10 mg via ORAL
  Filled 2021-09-06: qty 1

## 2021-09-06 NOTE — Discharge Instructions (Signed)

## 2021-09-06 NOTE — ED Provider Notes (Signed)
[facility] EMERGENCY DEPARTMENT Provider Note   CSN: [id number] Arrival date & time: 09/06/21  0046     History Chief Complaint  Patient presents with   Hypertension    Wanda Walsh is a 25 y.o. female.  The history is provided by the patient.  Hypertension This is a chronic problem. The current episode started more than 1 week ago. The problem occurs daily. The problem has been gradually worsening. Associated symptoms include chest pain and headaches. Nothing aggravates the symptoms. Nothing relieves the symptoms.   HPI: A 25 year old patient with a history of hypertension and obesity presents for evaluation of chest pain. Initial onset of pain was more than 6 hours ago. The patient's chest pain is not worse with exertion. The patient's chest pain is middle- or left-sided, is not well-localized, is not described as heaviness/pressure/tightness, is not sharp and does not radiate to the arms/jaw/neck. The patient does not complain of nausea and denies diaphoresis. The patient has no history of stroke, has no history of peripheral artery disease, has not smoked in the past 90 days, denies any history of treated diabetes, has no relevant family history of coronary artery disease (first degree relative at less than age 48) and has no history of hypercholesterolemia.  Patient reports over the past 2 days she has had a feeling that her blood pressure was elevated.  She reports she has had fullness in her chest and her neck.  She has had this previously.  She also reports fatigue and lack of energy.  She also reports brief episode of numbness in her left fingertips but no other symptoms.  No other weakness or numbness is reported.  She reports med compliance with lisinopril.  However she has not checked her blood pressure recently. No pleuritic pain.  No hemoptysis. No tearing or ripping sensation to her back She also reports recent dysuria and feeling she has a UTI She reports mild  headache that is improved.  No visual changes. Past Medical History:  Diagnosis Date   Hypertension        Past Surgical History:  Procedure Laterality Date   TONSILLECTOMY       OB History   No obstetric history on file.     No family history on file.  Social History   Tobacco Use   Smoking status: Never   Smokeless tobacco: Never  Substance Use Topics   Alcohol use: Not Currently   Drug use: Never    Home Medications Prior to Admission medications   Medication Sig Start Date End Date Taking? Authorizing Provider  lisinopril (ZESTRIL) 20 MG tablet Take 1 tablet (20 mg total) by mouth daily. 09/06/21  Yes [name], MD    Allergies    Patient has no known allergies.  Review of Systems   Review of Systems  Constitutional:  Negative for fever.  Eyes:  Negative for visual disturbance.  Cardiovascular:  Positive for chest pain.  Gastrointestinal:  Negative for vomiting.  Genitourinary:  Positive for dysuria.  Neurological:  Positive for headaches. Negative for syncope and weakness.  All other systems reviewed and are negative.  Physical Exam Updated Vital Signs BP (!) 141/101 (BP Location: Left Arm)   Pulse 94   Temp 99.1 F (37.3 C) (Oral)   Resp 17   Ht 1.664 m (5' 5.5")   Wt 119.7 kg   LMP 08/18/2021   SpO2 100%   BMI 43.26 kg/m   Physical Exam CONSTITUTIONAL: Well developed/well nourished HEAD: Normocephalic/atraumatic  EYES: EOMI/PERRL, no nystagmus, no ptosis ENMT: Mucous membranes moist NECK: supple no meningeal signs  SPINE/BACK:entire spine nontender CV: S1/S2 noted, no murmurs/rubs/gallops noted LUNGS: Lungs are clear to auscultation bilaterally, no apparent distress ABDOMEN: soft, nontender, no rebound or guarding GU:no cva tenderness NEURO:Awake/alert, face symmetric, no arm or leg drift is noted Equal 5/5 strength with shoulder abduction, elbow flex/extension, equal hand grips bilaterally Equal 5/5 strength with hip  flexion,knee flex/extension, foot dorsi/plantar flexion Cranial nerves 3/4/5/6/06/10/09/11/12 tested and intact Gait normal without ataxia Sensation to light touch intact in all extremities EXTREMITIES: pulses normal, full ROM, no calf tenderness/edema SKIN: warm, color normal PSYCH: no abnormalities of mood noted, alert and oriented to situation  ED Results / Procedures / Treatments   Labs (all labs ordered are listed, but only abnormal results are displayed) Labs Reviewed  CBC WITH DIFFERENTIAL/PLATELET - Abnormal; Notable for the following components:      Result Value   RBC 5.25 (*)    MCV 77.0 (*)    MCH 23.6 (*)    RDW 16.8 (*)    All other components within normal limits  BASIC METABOLIC PANEL - Abnormal; Notable for the following components:   Sodium 134 (*)    Potassium 3.3 (*)    Glucose, Bld 106 (*)    All other components within normal limits  URINALYSIS, ROUTINE W REFLEX MICROSCOPIC - Abnormal; Notable for the following components:   Color, Urine YELLOW (*)    Hgb urine dipstick TRACE (*)    All other components within normal limits  URINALYSIS, MICROSCOPIC (REFLEX) - Abnormal; Notable for the following components:   Bacteria, UA RARE (*)    All other components within normal limits  PREGNANCY, URINE  TROPONIN I (HIGH SENSITIVITY)  TROPONIN I (HIGH SENSITIVITY)    EKG EKG Interpretation  Date/Time:  Wednesday September 06 2021 01:20:12 EDT Ventricular Rate:  113 PR Interval:  144 QRS Duration: 97 QT Interval:  344 QTC Calculation: 472 R Axis:   49 Text Interpretation: Sinus tachycardia Consider right atrial enlargement Confirmed by [name] ([id number]) on 09/06/2021 1:27:22 AM  Radiology DG Chest 2 View  Result Date: 09/06/2021 CLINICAL DATA:  Chest pressure and numbness in her fingertips. EXAM: CHEST - 2 VIEW COMPARISON:  December 23, 2020 FINDINGS: The heart size and mediastinal contours are within normal limits. Both lungs are clear. The visualized  skeletal structures are unremarkable. IMPRESSION: No active cardiopulmonary disease. Electronically Signed   By: Aram Candela M.D.   On: 09/06/2021 02:00    Procedures Procedures   Medications Ordered in ED Medications  lisinopril (ZESTRIL) tablet 10 mg (10 mg Oral Given 09/06/21 0405)    ED Course  I have reviewed the triage vital signs and the nursing notes.  Pertinent labs & imaging results that were available during my care of the patient were reviewed by me and considered in my medical decision making (see chart for details).    MDM Rules/Calculators/A&P HEAR Score: 2                         Patient presented with chest pain, fullness in her neck and fatigue.  She was suspicious this was due to elevated blood pressure above her norm.  She reports med compliance.  On my evaluation, patient is in no acute distress without any focal neurologic deficits Overall work-up was reassuring.  Her vital signs improved and her most recent blood pressure is 131/99 I suspect patient will  need increase in her lisinopril.  She will plan to double her dose of lisinopril to 20 mg starting on October 6.  I have also given her a refill for 10 days once her other prescription is out.  She already has follow-up with her PCP in over 2 weeks   At this point there is no signs of acute stroke.  I have low suspicion for ACS/PE/dissection at this time Final Clinical Impression(s) / ED Diagnoses Final diagnoses:  Primary hypertension  Precordial pain    Rx / DC Orders ED Discharge Orders          Ordered    lisinopril (ZESTRIL) 20 MG tablet  Daily        09/06/21 0520             [name], MD 09/06/21 [phone]

## 2021-09-06 NOTE — ED Triage Notes (Signed)
Pt. Said she was having numbness in her fingertips and pressure in her chest.  Pt. Reports she has had UTI symptoms as well.

## 2021-10-21 ENCOUNTER — Encounter (HOSPITAL_BASED_OUTPATIENT_CLINIC_OR_DEPARTMENT_OTHER): Payer: Self-pay | Admitting: Urology

## 2021-10-21 ENCOUNTER — Other Ambulatory Visit: Payer: Self-pay

## 2021-10-21 ENCOUNTER — Emergency Department (HOSPITAL_BASED_OUTPATIENT_CLINIC_OR_DEPARTMENT_OTHER)
Admission: EM | Admit: 2021-10-21 | Discharge: 2021-10-21 | Disposition: A | Discharge status: Home / Self Care | Payer: Self-pay | Attending: Emergency Medicine | Admitting: Emergency Medicine

## 2021-10-21 DIAGNOSIS — Z79899 Other long term (current) drug therapy: Secondary | ICD-10-CM | POA: Insufficient documentation

## 2021-10-21 DIAGNOSIS — I10 Essential (primary) hypertension: Secondary | ICD-10-CM | POA: Insufficient documentation

## 2021-10-21 LAB — BASIC METABOLIC PANEL
Anion gap: 10 (ref 5–15)
BUN: 7 mg/dL (ref 6–20)
CO2: 24 mmol/L (ref 22–32)
Calcium: 9.3 mg/dL (ref 8.9–10.3)
Chloride: 103 mmol/L (ref 98–111)
Creatinine, Ser: 0.74 mg/dL (ref 0.44–1.00)
GFR, Estimated: >60 mL/min (ref >60)
Glucose, Bld: 109 mg/dL — ABNORMAL HIGH (ref 70–99)
Potassium: 3.8 mmol/L (ref 3.5–5.1)
Sodium: 137 mmol/L (ref 135–145)

## 2021-10-21 LAB — PREGNANCY, URINE: Preg Test, Ur: NEGATIVE

## 2021-10-21 NOTE — ED Provider Notes (Signed)
[facility] EMERGENCY DEPARTMENT Provider Note   CSN: [id number] Arrival date & time: 10/21/21  1337     History Chief Complaint  Patient presents with   Hypertension    Wanda Walsh is a 25 y.o. female.  The history is provided by the patient.  Hypertension This is a chronic problem. The problem occurs daily. The problem has not changed since onset.Pertinent negatives include no chest pain, no abdominal pain, no headaches and no shortness of breath. Nothing aggravates the symptoms. Nothing relieves the symptoms. She has tried nothing for the symptoms. The treatment provided no relief.      Past Medical History:  Diagnosis Date   Hypertension     There are no problems to display for this patient.   Past Surgical History:  Procedure Laterality Date   TONSILLECTOMY       OB History   No obstetric history on file.     History reviewed. No pertinent family history.  Social History   Tobacco Use   Smoking status: Never   Smokeless tobacco: Never  Substance Use Topics   Alcohol use: Not Currently   Drug use: Never    Home Medications Prior to Admission medications   Medication Sig Start Date End Date Taking? Authorizing Provider  lisinopril (ZESTRIL) 20 MG tablet Take 1 tablet (20 mg total) by mouth daily. 09/06/21  Yes [name], MD    Allergies    Patient has no known allergies.  Review of Systems   Review of Systems  Constitutional:  Negative for chills and fever.  HENT:  Negative for ear pain and sore throat.   Eyes:  Negative for pain and visual disturbance.  Respiratory:  Negative for cough and shortness of breath.   Cardiovascular:  Negative for chest pain and palpitations.  Gastrointestinal:  Negative for abdominal pain and vomiting.  Genitourinary:  Negative for dysuria and hematuria.  Musculoskeletal:  Negative for arthralgias and back pain.  Skin:  Negative for color change and rash.  Neurological:  Negative for seizures,  syncope and headaches.  All other systems reviewed and are negative.  Physical Exam Updated Vital Signs BP (!) 143/101   Pulse 93   Temp 98.5 F (36.9 C) (Oral)   Resp 18   Ht 5\' 6"  (1.676 m)   Wt 116.6 kg   LMP 10/19/2021 (Exact Date)   SpO2 99%   BMI 41.48 kg/m   Physical Exam Vitals and nursing note reviewed.  Constitutional:      General: She is not in acute distress.    Appearance: She is well-developed. She is not ill-appearing.  HENT:     Head: Normocephalic and atraumatic.     Nose: Nose normal.     Mouth/Throat:     Mouth: Mucous membranes are moist.  Eyes:     Extraocular Movements: Extraocular movements intact.     Conjunctiva/sclera: Conjunctivae normal.     Pupils: Pupils are equal, round, and reactive to light.  Cardiovascular:     Rate and Rhythm: Normal rate and regular rhythm.     Pulses: Normal pulses.     Heart sounds: Normal heart sounds. No murmur heard. Pulmonary:     Effort: Pulmonary effort is normal. No respiratory distress.     Breath sounds: Normal breath sounds.  Abdominal:     Palpations: Abdomen is soft.     Tenderness: There is no abdominal tenderness.  Musculoskeletal:        General: No swelling.  Cervical back: Normal range of motion and neck supple.  Skin:    General: Skin is warm and dry.     Capillary Refill: Capillary refill takes less than 2 seconds.  Neurological:     General: No focal deficit present.     Mental Status: She is alert and oriented to person, place, and time.  Psychiatric:        Mood and Affect: Mood normal.    ED Results / Procedures / Treatments   Labs (all labs ordered are listed, but only abnormal results are displayed) Labs Reviewed  BASIC METABOLIC PANEL - Abnormal; Notable for the following components:      Result Value   Glucose, Bld 109 (*)    All other components within normal limits  PREGNANCY, URINE  CBC WITH DIFFERENTIAL/PLATELET    EKG EKG Interpretation  Date/Time:  Saturday  October 21 2021 14:57:50 EST Ventricular Rate:  111 PR Interval:  136 QRS Duration: 86 QT Interval:  344 QTC Calculation: 467 R Axis:   44 Text Interpretation: Sinus tachycardia No significant change since last tracing Confirmed by Lennice Sites ([id number]) on 10/21/2021 3:22:51 PM  Radiology No results found.  Procedures Procedures   Medications Ordered in ED Medications - No data to display  ED Course  I have reviewed the triage vital signs and the nursing notes.  Pertinent labs & imaging results that were available during my care of the patient were reviewed by me and considered in my medical decision making (see chart for details).    MDM Rules/Calculators/A&P                           [name] is here with high blood pressure.  Blood pressure upon my evaluation is 143/100.  Normal vitals otherwise.  Had extensive work-up at outside hospital 4 days ago.  Had negative D-dimer, 2 negative troponins, unremarkable CBC and BMP.  Kidney function okay today.  CBC clotted but will not repeat as she had normal hemoglobin several days ago.  Not having any melena or bleeding.  Pregnancy test is negative.  EKG shows sinus rhythm.  She is not having any active chest pain or headache.  Neurologically she is intact.  She had her lisinopril dose increased several days ago.  She has follow-up with her cardiologist next week to further discuss blood pressure management.  Overall she is asymptomatic.  Blood pressure overall unremarkable.  Will let cardiology make further changes.  Discharged in good condition.  This chart was dictated using voice recognition software.  Despite best efforts to proofread,  errors can occur which can change the documentation meaning.   Final Clinical Impression(s) / ED Diagnoses Final diagnoses:  Hypertension, unspecified type    Rx / DC Orders ED Discharge Orders     None        Lennice Sites, DO 10/21/21 1747

## 2021-10-21 NOTE — ED Notes (Signed)
ED Provider at bedside. 

## 2021-10-21 NOTE — ED Triage Notes (Signed)
Pt states BP 174/120 at home, triage RN for pcp said to come to ER. States mild HA, denies and n/v denies vision changes  NAD at time of triage.  A&O x 4

## 2021-10-21 NOTE — ED Notes (Signed)
AVS provided to and discussed with patient. Pt verbalizes understanding of discharge instructions and denies questions or concerns at this time.

## 2021-10-21 NOTE — ED Notes (Signed)
She is a hard stick for the redraw.

## 2022-06-11 IMAGING — DX DG CHEST 2V
2 series · 2 of 2 positions shown · non-contrast
Comparison: December 23, 2020

CLINICAL DATA: Chest pressure and numbness in her fingertips.

EXAM:
CHEST - 2 VIEW

[chest pa]
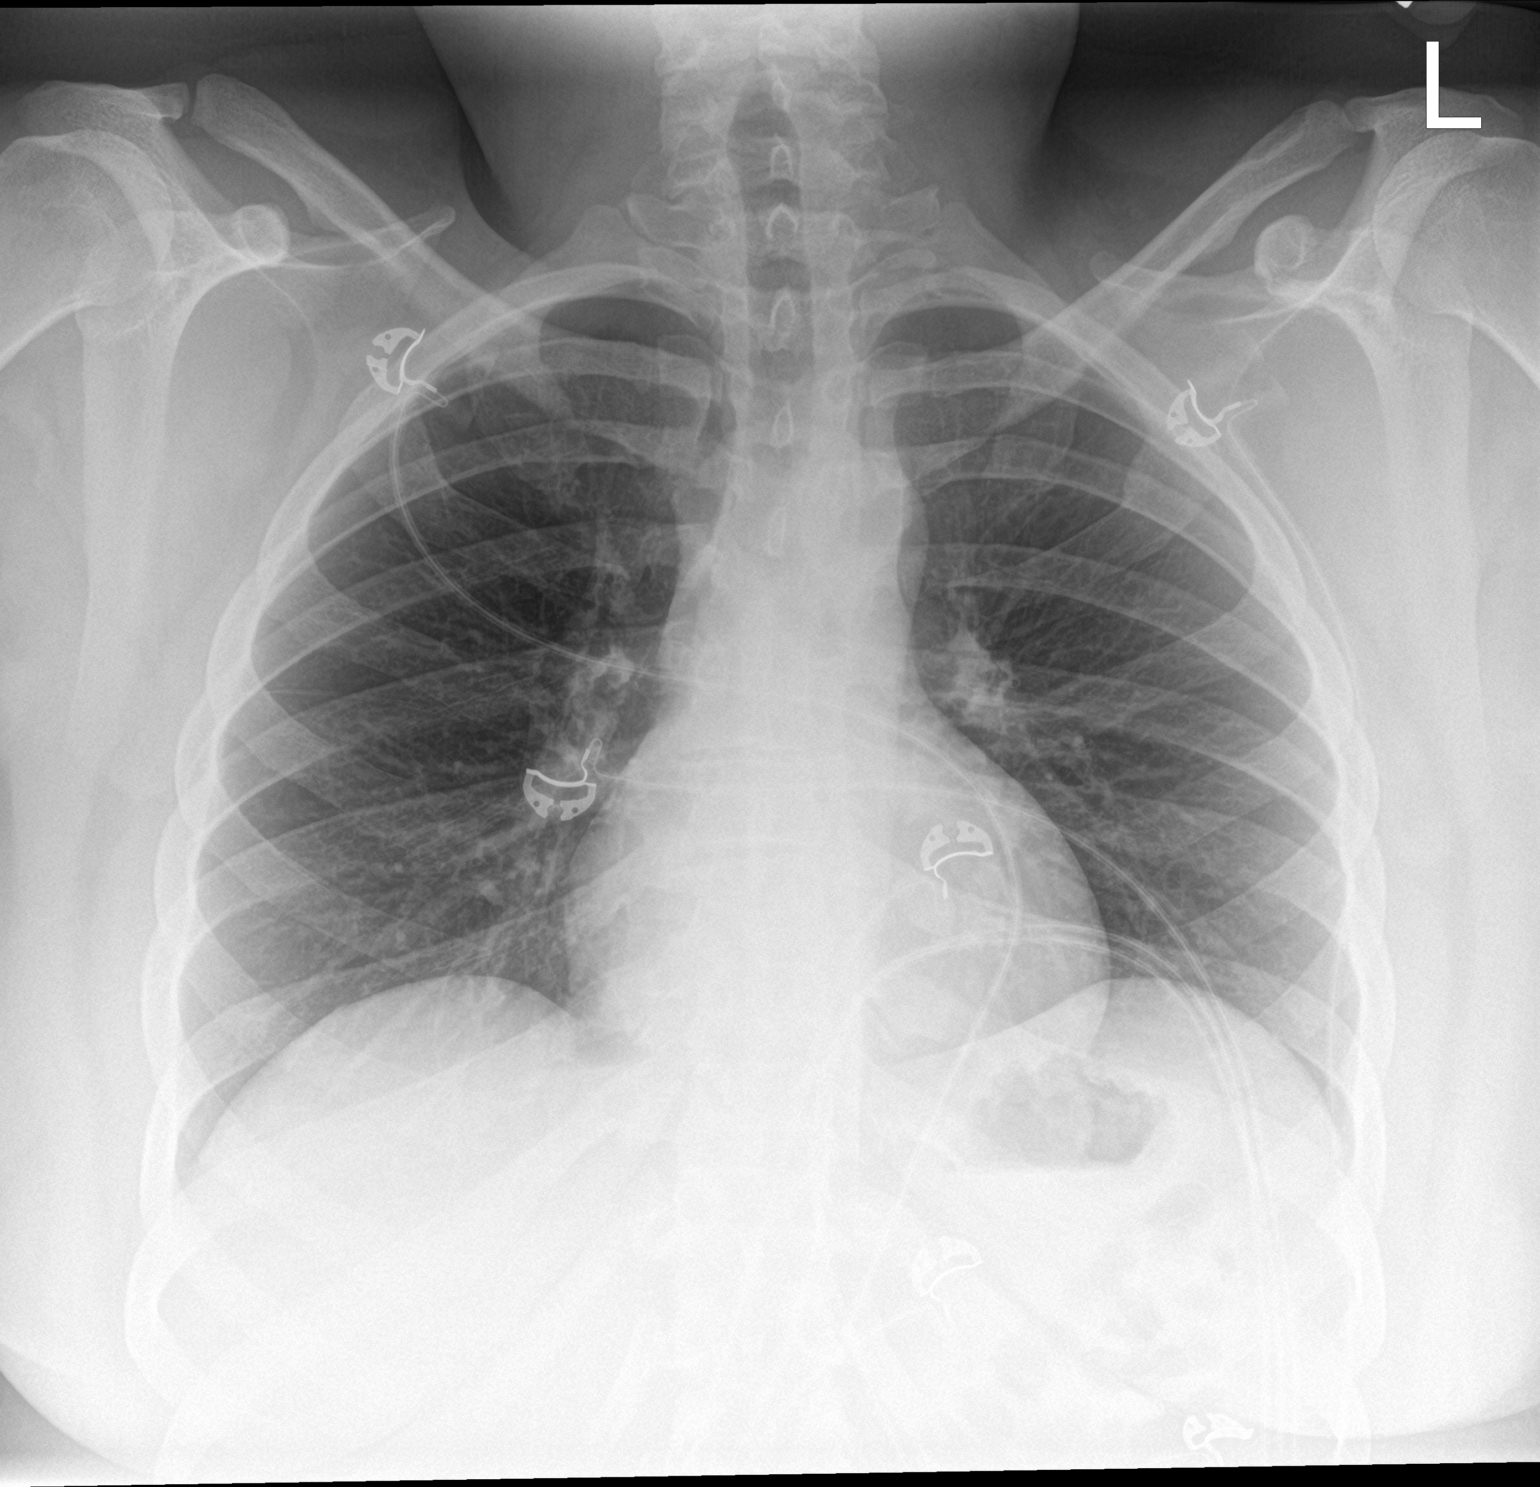

[chest lat]
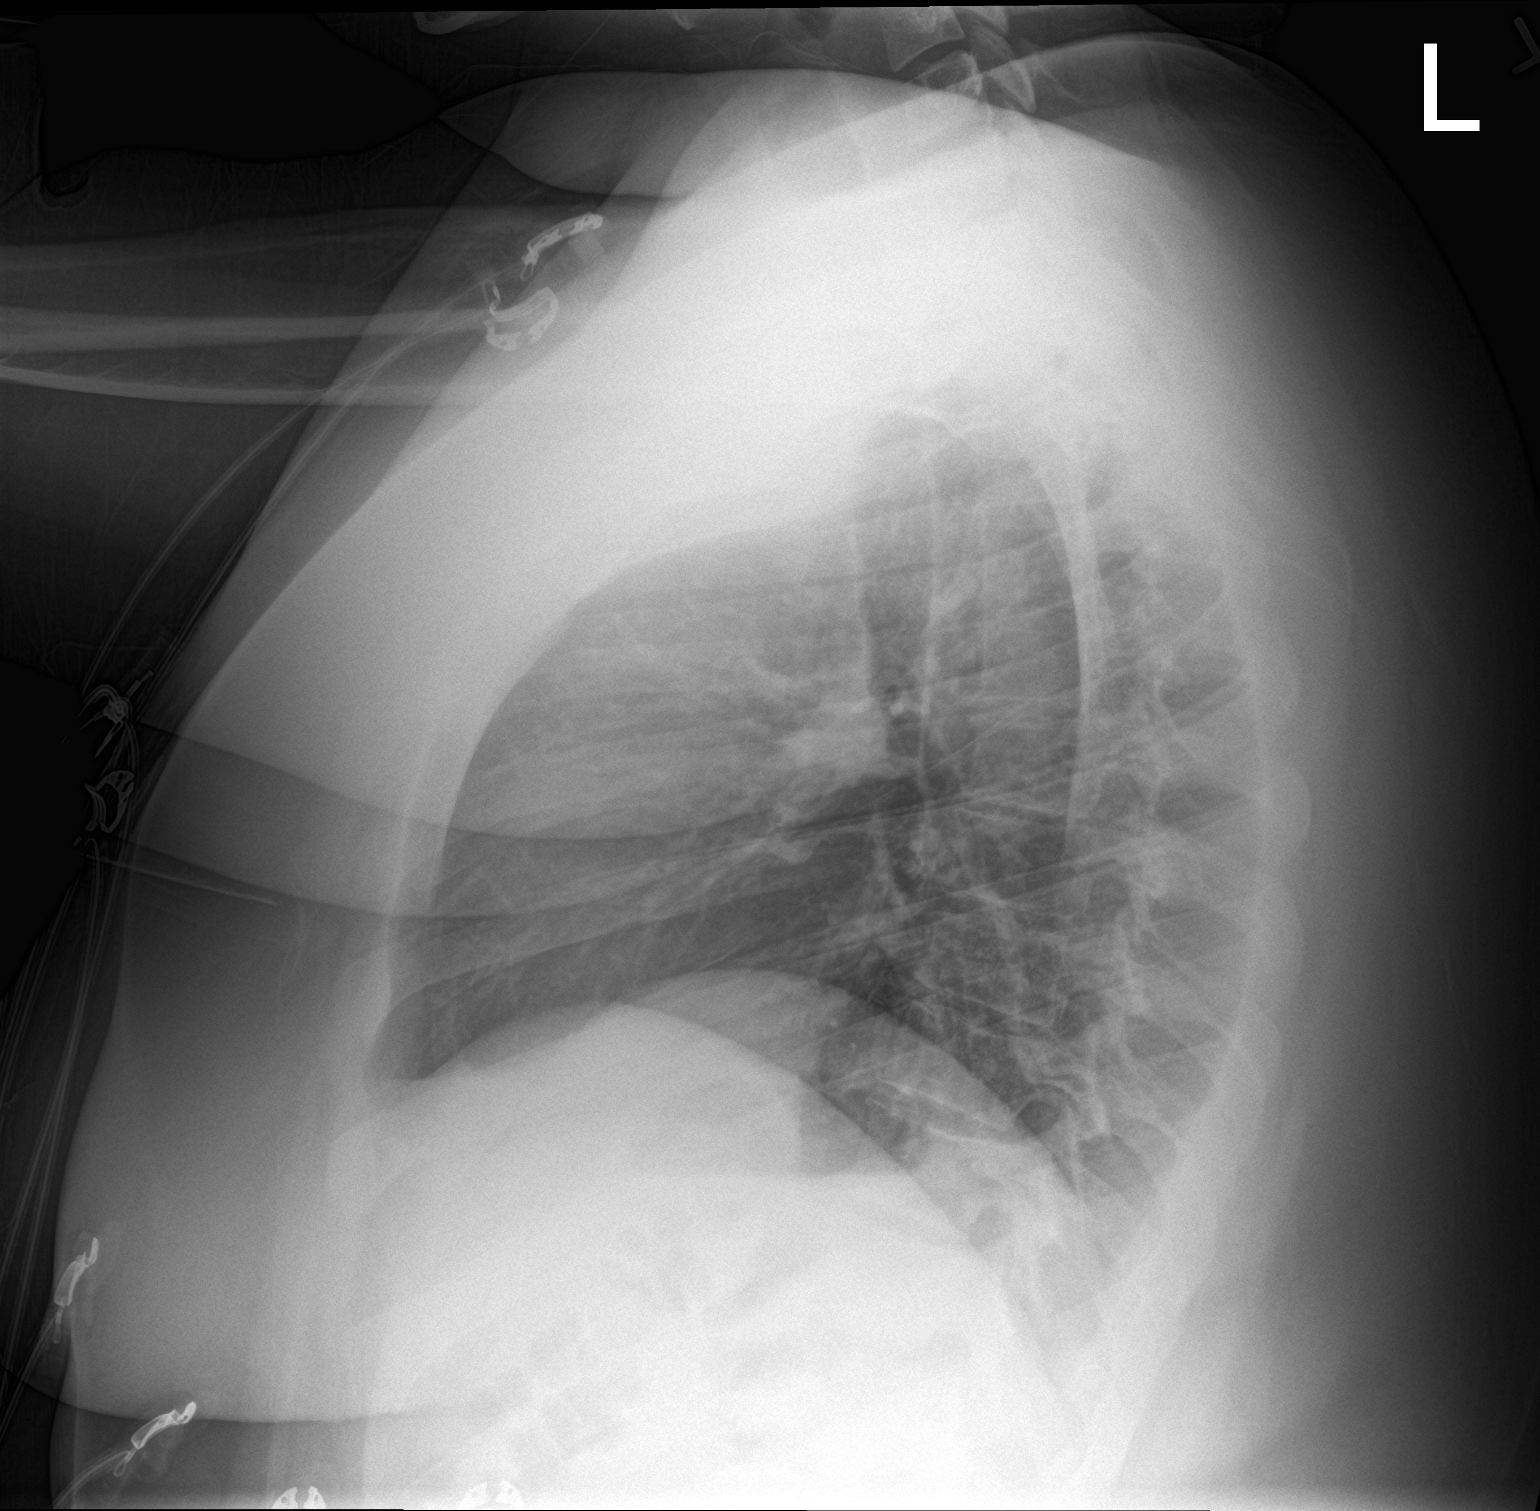

[2 of 2 positions shown; findings below may reference images not displayed]

FINDINGS: The heart size and mediastinal contours are within normal limits.
Both lungs are clear. The visualized skeletal structures are
unremarkable.
IMPRESSION: No active cardiopulmonary disease.
